# Patient Record
Sex: Female | Born: 1995 | Race: Black or African American | Hispanic: No | Marital: Single
Health system: Southern US, Community
[De-identification: ages and names within clinical notes are randomized; demographics above are authoritative.]

## PROBLEM LIST (undated history)

## (undated) DIAGNOSIS — R51 Headache: Secondary | ICD-10-CM

## (undated) DIAGNOSIS — R519 Headache, unspecified: Secondary | ICD-10-CM

## (undated) DIAGNOSIS — E041 Nontoxic single thyroid nodule: Secondary | ICD-10-CM

## (undated) DIAGNOSIS — E669 Obesity, unspecified: Secondary | ICD-10-CM

## (undated) DIAGNOSIS — H35379 Puckering of macula, unspecified eye: Secondary | ICD-10-CM

## (undated) HISTORY — DX: Headache, unspecified: R51.9

## (undated) HISTORY — DX: Nontoxic single thyroid nodule: E04.1

## (undated) HISTORY — DX: Obesity, unspecified: E66.9

## (undated) HISTORY — DX: Headache: R51

## (undated) HISTORY — DX: Puckering of macula, unspecified eye: H35.379

---
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
## 2017-03-09 ENCOUNTER — Encounter (INDEPENDENT_AMBULATORY_CARE_PROVIDER_SITE_OTHER): Payer: Self-pay | Admitting: Pediatric Endocrinology

## 2017-03-09 ENCOUNTER — Ambulatory Visit (INDEPENDENT_AMBULATORY_CARE_PROVIDER_SITE_OTHER): Payer: Managed Care, Other (non HMO) | Admitting: Pediatric Endocrinology

## 2017-03-09 DIAGNOSIS — E042 Nontoxic multinodular goiter: Secondary | ICD-10-CM | POA: Diagnosis not present

## 2017-03-09 DIAGNOSIS — E559 Vitamin D deficiency, unspecified: Secondary | ICD-10-CM

## 2017-03-09 LAB — POCT GLUCOSE (DEVICE FOR HOME USE): GLUCOSE FASTING, POC: 96 mg/dL (ref 70–99)

## 2017-03-09 LAB — POCT GLYCOSYLATED HEMOGLOBIN (HGB A1C): HEMOGLOBIN A1C: 5.3

## 2017-03-09 NOTE — Progress Notes (Signed)
Subjective:  Subjective  Patient Name: Deborah Smith Date of Birth: 1995/11/30  MRN: [id number]  Deborah Smith  presents to the office today for follow up evaluation and management of her multinodular thyroid goiter  HISTORY OF PRESENT ILLNESS:   Deborah Smith is a 21 y.o. Mixed race female   Deborah Smith was accompanied by her father   1. Deborah Smith was seen by her PCP in February 2017 for follow up of a thyroid goiter. She had an ultrasound in 2016 and a repeat in 2017. Both ultrasounds showed multinodular goiter with no dominant nodules and did not meet criteria for biopsy.  She was complaining of fatigue, hair loss, constipation, and weight gain. She had TFTs drawn which were normal. She was incidentally found to have a vitamin d level of 10.7 She was referred to endocrinology for further evaluation and management.   2. Deborah "Tey-ah" was last seen in [facility] on 09/08/16. In the interim she has been generally healthy.   She was dancing all winter- her season ended this spring. It was her last team dance season and she is a little sad about that- but not really. 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  She has been drinking only water and not eating out as often. She is not taking Synthroid. She is stable on generic Sprintec. She is taking vit D 1000 IU/day.   She has been working out on an elliptical. She usually spends about 20 min-60 minutes. She says that the electric panel display does not work so she doesn't know speed or distance. She does get hot and sweaty and increases her heart rate.   She has not recognized a change in her appetite. She feels that her clothes are a little baggy on her- mostly her pants. She only likes to shop for shirts.   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   She has not been having any issues with her thyroid.   She has not had tachycardia. She did not realize how much weight she lost.      3.. Pertinent Review of  Systems:  Constitutional: The patient feels "alright". The patient seems healthy and active. No headache today.  Eyes: Vision seems to be good. There are no recognized eye problems. Had eye surgery in June 2017 to remove scar tissue of uncertain etiology - vision now improved.  Neck: Per HPI Heart: Heart rate increases with exercise or other physical activity. The patient has no complaints of palpitations, irregular heart beats, chest pain, or chest pressure.   Gastrointestinal: Bowel movents seem normal. The patient has no complaints of excessive hunger, acid reflux, upset stomach, stomach aches or pains, diarrhea.  Legs: Muscle mass and strength seem normal. There are no complaints of numbness, tingling, burning, or pain. No edema is noted.  Feet: There are no obvious foot problems. There are no complaints of numbness, tingling, burning, or pain. No edema is noted. Neurologic: There are no recognized problems with muscle movement and strength, sensation, or coordination. GYN/GU: Periods have improved Continues on Sprintec- cycles are regular. Skin: no skin issues or facial hair   PAST MEDICAL, FAMILY, AND SOCIAL HISTORY  Past Medical History:  Diagnosis Date  . Epiretinal membrane    lamellar hole OS  . Generalized headaches   . Obesity   . Thyroid cyst    Multiple; Rescan Feb 2017    Family History  Problem Relation  Age of Onset  . Diabetes Father   . Hyperlipidemia Father   . Hypertension Father   . Stroke Father   . Diabetes Maternal Grandfather   . Diabetes Paternal Grandmother   . Hypertension Paternal Grandmother   . Hyperlipidemia Paternal Grandmother   . Stroke Paternal Grandmother   . Diabetes Paternal Grandfather   . Hypertension Paternal Grandfather   . Hyperlipidemia Paternal Grandfather   . Stroke Paternal Grandfather      Current Outpatient Prescriptions:  .  cholecalciferol (VITAMIN D) 1000 units tablet, Take 1,000 Units by mouth daily., Disp: , Rfl:  .   levothyroxine (SYNTHROID) 25 MCG tablet, Take 1 tablet (25 mcg total) by mouth daily before breakfast., Disp: 30 tablet, Rfl: 6 .  SPRINTEC 28 0.25-35 MG-MCG tablet, Take 1 tablet by mouth daily., Disp: , Rfl:   Allergies as of 03/09/2017 - Review Complete 03/09/2017  Allergen Reaction Noted  . Penicillins Hives 11/21/2015     reports that she is a non-smoker but has been exposed to tobacco smoke. She has never used smokeless tobacco. She reports that she does not drink alcohol or use drugs. Pediatric History  Patient Guardian Status  . Mother:  Saliah, Crisp  . Father:  Raniya, Golembeski   Other Topics Concern  . Not on file   Social History Narrative   Lives at home with mom and sister, attends [employer] is in the 11th grade.     1. School and Family: 12th grade at [employer]  Starting [employer] in the fall. Considering Psychology.  2. Activities: exercising her thumbs playing video games. Dance/ elliptical.  3. Primary Care Provider: Kerman Passey, MD  ROS: There are no other significant problems involving Karsyn's other body systems.    Objective:  Objective  Vital Signs:  BP 106/64   Pulse 88   Ht 5' 0.08" (1.526 m)   Wt 140 lb 12.8 oz (63.9 kg)   BMI 27.43 kg/m  Blood pressure percentiles are 38.9 % systolic and 48.2 % diastolic based on the August 2017 AAP Clinical Practice Guideline.    Ht Readings from Last 3 Encounters:  03/09/17 5' 0.08" (1.526 m) (5 %, Z= -1.63)*  07/24/16 5' (1.524 m) (5 %, Z= -1.65)*  06/03/16 5' 0.43" (1.535 m) (7 %, Z= -1.48)*   * Growth percentiles are based on CDC 2-20 Years data.   Wt Readings from Last 3 Encounters:  03/09/17 140 lb 12.8 oz (63.9 kg) (74 %, Z= 0.66)*  09/08/16 172 lb 6.4 oz (78.2 kg) (94 %, Z= 1.54)*  07/24/16 183 lb (83 kg) (96 %, Z= 1.74)*   * Growth percentiles are based on CDC 2-20 Years data.   HC Readings from Last 3 Encounters:  No data found for [facility]   Body surface area is 1.65 meters squared. 5  %ile (Z= -1.63) based on CDC 2-20 Years stature-for-age data using vitals from 03/09/2017. 74 %ile (Z= 0.66) based on CDC 2-20 Years weight-for-age data using vitals from 03/09/2017.    PHYSICAL EXAM: \ Constitutional: The patient appears healthy and well nourished. The patient's height and weight are advanced for age. She has lost over 30 pounds since her last visit.  Head: The head is normocephalic. Face: The face appears normal. There are no obvious dysmorphic features. Eyes: The eyes appear to be normally formed and spaced. Gaze is conjugate. There is no obvious arcus or proptosis. Moisture appears normal. Ears: The ears are normally placed and appear externally normal. Mouth: The  oropharynx and tongue appear normal. Dentition appears to be normal for age. Oral moisture is normal. Neck: The neck appears to be visibly normal. BL carotid bruits are noted. The thyroid gland is modestly enlarged in size- it is somewhat smaller than last exam. The consistency of the thyroid gland is normal.  The thyroid gland is not tender to palpation. Lungs: The lungs are clear to auscultation. Air movement is good. Heart: Heart rate and rhythm are regular. Heart sounds S1 and S2 are normal. I did not appreciate any pathologic cardiac murmurs. Abdomen: The abdomen appears to be enlarged in size for the patient's age. Bowel sounds are normal. There is no obvious hepatomegaly, splenomegaly, or other mass effect.  Arms: Muscle size and bulk are normal for age. Hands: There is no obvious tremor. Phalangeal and metacarpophalangeal joints are normal. Palmar muscles are normal for age. Palmar skin is normal. Palmar moisture is also normal. Legs: Muscles appear normal for age. No edema is present. Feet: Feet are normally formed. Dorsalis pedal pulses are normal. Neurologic: Strength is normal for age in both the upper and lower extremities. Muscle tone is normal. Sensation to touch is normal in both the legs and feet.     LAB DATA:   Results for orders placed or performed in visit on 03/09/17 (from the past 672 hour(s))  POCT Glucose (Device for Home Use)   Collection Time: 03/09/17  2:12 PM  Result Value Ref Range   Glucose Fasting, POC 96 70 - 99 mg/dL   POC Glucose  70 - 99 mg/dl  POCT HgB Z6X   Collection Time: 03/09/17  2:40 PM  Result Value Ref Range   Hemoglobin A1C 5.3     Neck ultrasound 07/20/16 Isthmus: 0.2 cm, previously 0.6 cm  No discrete nodules are identified within the thyroid isthmus.  _________________________________________________________  Right lobe: 5.0 x 1.0 x 2.2 cm, previously 4.8 x 1.2 x 1.8 cm  Small nodules measuring 0.4 cm or less.  Not significantly changed.  _________________________________________________________  Left lobe: 5.3 x 1.2 x 1.9 cm, previously 5.1 x 1.5 x 1.6 cm  Small nodules measuring 0.4 cm or less.  Not significantly changed.  IMPRESSION: Stable bilateral small nodules measuring 0.4 cm or less in size.     Assessment and Plan:  Assessment  ASSESSMENT: Aleesa is a 21 y.o. mixed race female with multinodular goiter. Compression symptoms and size of goiter have reduced with low dose Synthroid. Repeat ultrasound at last visit showed stable nodules with no progression. She is no longer taking synthroid.   She has had significant weight loss since last visit and is now considered a healthy weight for her height. She is surprised that she has lost so much weight. She has been working on eating and drinking healthy and has been active daily. She is excited to start college this fall and has plans to continue to be active there.   Will plan to repeat her thyroid ultrasound this fall. She is clinically euthyroid. She is off therapy. She never had abnormal thyroid labs.   She has continued on Vit D 1000 IU/day.   Hemoglobin A1C which was elevated at last visit has decreased nicely with increase in physical activity.    PLAN:  1.  Diagnostic: A1C as above. TFTs and Vit D level and thyroid ultrasound for next visit.  2. Therapeutic:Continue Vit D and generic Sprintec. No synthroid for now.  3. Patient education: Discussed changes with weight loss and change in body since  last visit. She has been exercising more and focused on eating and drinking more healthy. She does not appear to be purging or restricting. She is overall very happy today. Family asked many questions and seemed satisfied with discussion and plan today.  4. Follow-up: Return in about 6 months (around 09/09/2017). Family to call in August to schedule thyroid ultrasound for over her fall break. Follow up appointment over thanksgiving.      Dessa Phi, MD   Level of Service: This visit lasted in excess of 25 minutes. More than 50% of the visit was devoted to counseling.

## 2017-03-09 NOTE — Patient Instructions (Signed)
In August- please call the office with your fall schedule and talk to [facility] University[facility]a or Gala MurdochKassina about scheduling your ultrasound thyroid for fall break.   No labs at this time.   Roller [place]-

## 2017-03-25 ENCOUNTER — Ambulatory Visit: Payer: Managed Care, Other (non HMO) | Admitting: Family Medicine

## 2017-03-31 ENCOUNTER — Ambulatory Visit: Payer: Managed Care, Other (non HMO) | Admitting: Family Medicine

## 2017-09-08 ENCOUNTER — Ambulatory Visit (INDEPENDENT_AMBULATORY_CARE_PROVIDER_SITE_OTHER): Payer: Managed Care, Other (non HMO) | Admitting: Family Medicine

## 2017-09-08 ENCOUNTER — Encounter: Payer: Self-pay | Admitting: Family Medicine

## 2017-09-08 VITALS — BP 114/62 | HR 83 | Temp 97.8°F | Resp 14 | Ht 60.05 in | Wt 134.6 lb

## 2017-09-08 DIAGNOSIS — E042 Nontoxic multinodular goiter: Secondary | ICD-10-CM

## 2017-09-08 DIAGNOSIS — Z111 Encounter for screening for respiratory tuberculosis: Secondary | ICD-10-CM | POA: Diagnosis not present

## 2017-09-08 DIAGNOSIS — Z23 Encounter for immunization: Secondary | ICD-10-CM

## 2017-09-08 DIAGNOSIS — Z0000 Encounter for general adult medical examination without abnormal findings: Secondary | ICD-10-CM | POA: Insufficient documentation

## 2017-09-08 LAB — CBC WITH DIFFERENTIAL/PLATELET
BASOS PCT: 0.5 %
Basophils Absolute: 30 cells/uL (ref 0–200)
EOS ABS: 47 {cells}/uL (ref 15–500)
Eosinophils Relative: 0.8 %
HEMATOCRIT: 42.9 % (ref 35.0–45.0)
Hemoglobin: 14.6 g/dL (ref 11.7–15.5)
LYMPHS ABS: 2213 {cells}/uL (ref 850–3900)
MCH: 29.8 pg (ref 27.0–33.0)
MCHC: 34 g/dL (ref 32.0–36.0)
MCV: 87.6 fL (ref 80.0–100.0)
MPV: 11 fL (ref 7.5–12.5)
Monocytes Relative: 6.4 %
NEUTROS PCT: 54.8 %
Neutro Abs: 3233 cells/uL (ref 1500–7800)
PLATELETS: 292 10*3/uL (ref 140–400)
RBC: 4.9 10*6/uL (ref 3.80–5.10)
RDW: 13 % (ref 11.0–15.0)
TOTAL LYMPHOCYTE: 37.5 %
WBC: 5.9 10*3/uL (ref 3.8–10.8)
WBCMIX: 378 {cells}/uL (ref 200–950)

## 2017-09-08 LAB — LIPID PANEL
Cholesterol: 176 mg/dL — ABNORMAL HIGH (ref <170)
HDL: 68 mg/dL (ref >45)
LDL Cholesterol (Calc): 95 mg/dL (calc) (ref <110)
NON-HDL CHOLESTEROL (CALC): 108 mg/dL (ref <120)
Total CHOL/HDL Ratio: 2.6 (calc) (ref <5.0)
Triglycerides: 50 mg/dL (ref <90)

## 2017-09-08 LAB — COMPLETE METABOLIC PANEL WITH GFR
AG RATIO: 1.7 (calc) (ref 1.0–2.5)
ALT: 13 U/L (ref 5–32)
AST: 15 U/L (ref 12–32)
Albumin: 5 g/dL (ref 3.6–5.1)
Alkaline phosphatase (APISO): 98 U/L (ref 47–176)
BILIRUBIN TOTAL: 0.5 mg/dL (ref 0.2–1.1)
BUN: 7 mg/dL (ref 7–20)
CALCIUM: 10.1 mg/dL (ref 8.9–10.4)
CHLORIDE: 104 mmol/L (ref 98–110)
CO2: 25 mmol/L (ref 20–32)
Creat: 0.68 mg/dL (ref 0.50–1.00)
GFR, EST NON AFRICAN AMERICAN: 127 mL/min/{1.73_m2} (ref >=60)
GFR, Est African American: 147 mL/min/{1.73_m2} (ref >=60)
GLOBULIN: 3 g/dL (ref 2.0–3.8)
Glucose, Bld: 87 mg/dL (ref 65–139)
POTASSIUM: 4.1 mmol/L (ref 3.8–5.1)
SODIUM: 139 mmol/L (ref 135–146)
TOTAL PROTEIN: 8 g/dL (ref 6.3–8.2)

## 2017-09-08 LAB — TSH: TSH: 1.16 mIU/L

## 2017-09-08 NOTE — Assessment & Plan Note (Signed)
Check TSH today

## 2017-09-08 NOTE — Patient Instructions (Addendum)
Meningitis booster given today You will not need another meningitis booster, unless there are special circumstances in your life in five years that would require it Your next tetanus booster will be due in 2021   Health Maintenance, Female Adopting a healthy lifestyle and getting preventive care can go a long way to promote health and wellness. Talk with your health care provider about what schedule of regular examinations is right for you. This is a good chance for you to check in with your provider about disease prevention and staying healthy. In between checkups, there are plenty of things you can do on your own. Experts have done a lot of research about which lifestyle changes and preventive measures are most likely to keep you healthy. Ask your health care provider for more information. Weight and diet Eat a healthy diet  Be sure to include plenty of vegetables, fruits, low-fat dairy products, and lean protein.  Do not eat a lot of foods high in solid fats, added sugars, or salt.  Get regular exercise. This is one of the most important things you can do for your health. ? Most adults should exercise for at least 150 minutes each week. The exercise should increase your heart rate and make you sweat (moderate-intensity exercise). ? Most adults should also do strengthening exercises at least twice a week. This is in addition to the moderate-intensity exercise.  Maintain a healthy weight  Body mass index (BMI) is a measurement that can be used to identify possible weight problems. It estimates body fat based on height and weight. Your health care provider can help determine your BMI and help you achieve or maintain a healthy weight.  For females 38 years of age and older: ? A BMI below 18.5 is considered underweight. ? A BMI of 18.5 to 24.9 is normal. ? A BMI of 25 to 29.9 is considered overweight. ? A BMI of 30 and above is considered obese.  Watch levels of cholesterol and blood  lipids  You should start having your blood tested for lipids and cholesterol at 19 years of age, then have this test every 5 years.  You may need to have your cholesterol levels checked more often if: ? Your lipid or cholesterol levels are high. ? You are older than 19 years of age. ? You are at high risk for heart disease.  Cancer screening Lung Cancer  Lung cancer screening is recommended for adults 62-59 years old who are at high risk for lung cancer because of a history of smoking.  A yearly low-dose CT scan of the lungs is recommended for people who: ? Currently smoke. ? Have quit within the past 15 years. ? Have at least a 30-pack-year history of smoking. A pack year is smoking an average of one pack of cigarettes a day for 1 year.  Yearly screening should continue until it has been 15 years since you quit.  Yearly screening should stop if you develop a health problem that would prevent you from having lung cancer treatment.  Breast Cancer  Practice breast self-awareness. This means understanding how your breasts normally appear and feel.  It also means doing regular breast self-exams. Let your health care provider know about any changes, no matter how small.  If you are in your 20s or 30s, you should have a clinical breast exam (CBE) by a health care provider every 1-3 years as part of a regular health exam.  If you are 44 or older, have a CBE  every year. Also consider having a breast X-ray (mammogram) every year.  If you have a family history of breast cancer, talk to your health care provider about genetic screening.  If you are at high risk for breast cancer, talk to your health care provider about having an MRI and a mammogram every year.  Breast cancer gene (BRCA) assessment is recommended for women who have family members with BRCA-related cancers. BRCA-related cancers include: ? Breast. ? Ovarian. ? Tubal. ? Peritoneal cancers.  Results of the assessment will  determine the need for genetic counseling and BRCA1 and BRCA2 testing.  Cervical Cancer Your health care provider may recommend that you be screened regularly for cancer of the pelvic organs (ovaries, uterus, and vagina). This screening involves a pelvic examination, including checking for microscopic changes to the surface of your cervix (Pap test). You may be encouraged to have this screening done every 3 years, beginning at age 59.  For women ages 61-65, health care providers may recommend pelvic exams and Pap testing every 3 years, or they may recommend the Pap and pelvic exam, combined with testing for human papilloma virus (HPV), every 5 years. Some types of HPV increase your risk of cervical cancer. Testing for HPV may also be done on women of any age with unclear Pap test results.  Other health care providers may not recommend any screening for nonpregnant women who are considered low risk for pelvic cancer and who do not have symptoms. Ask your health care provider if a screening pelvic exam is right for you.  If you have had past treatment for cervical cancer or a condition that could lead to cancer, you need Pap tests and screening for cancer for at least 20 years after your treatment. If Pap tests have been discontinued, your risk factors (such as having a new sexual partner) need to be reassessed to determine if screening should resume. Some women have medical problems that increase the chance of getting cervical cancer. In these cases, your health care provider may recommend more frequent screening and Pap tests.  Colorectal Cancer  This type of cancer can be detected and often prevented.  Routine colorectal cancer screening usually begins at 19 years of age and continues through 19 years of age.  Your health care provider may recommend screening at an earlier age if you have risk factors for colon cancer.  Your health care provider may also recommend using home test kits to check  for hidden blood in the stool.  A small camera at the end of a tube can be used to examine your colon directly (sigmoidoscopy or colonoscopy). This is done to check for the earliest forms of colorectal cancer.  Routine screening usually begins at age 66.  Direct examination of the colon should be repeated every 5-10 years through 19 years of age. However, you may need to be screened more often if early forms of precancerous polyps or small growths are found.  Skin Cancer  Check your skin from head to toe regularly.  Tell your health care provider about any new moles or changes in moles, especially if there is a change in a mole's shape or color.  Also tell your health care provider if you have a mole that is larger than the size of a pencil eraser.  Always use sunscreen. Apply sunscreen liberally and repeatedly throughout the day.  Protect yourself by wearing long sleeves, pants, a wide-brimmed hat, and sunglasses whenever you are outside.  Heart disease, diabetes,  and high blood pressure  High blood pressure causes heart disease and increases the risk of stroke. High blood pressure is more likely to develop in: ? People who have blood pressure in the high end of the normal range (130-139/85-89 mm Hg). ? People who are overweight or obese. ? People who are African American.  If you are 75-23 years of age, have your blood pressure checked every 3-5 years. If you are 40 years of age or older, have your blood pressure checked every year. You should have your blood pressure measured twice-once when you are at a hospital or clinic, and once when you are not at a hospital or clinic. Record the average of the two measurements. To check your blood pressure when you are not at a hospital or clinic, you can use: ? An automated blood pressure machine at a pharmacy. ? A home blood pressure monitor.  If you are between 42 years and 33 years old, ask your health care provider if you should take  aspirin to prevent strokes.  Have regular diabetes screenings. This involves taking a blood sample to check your fasting blood sugar level. ? If you are at a normal weight and have a low risk for diabetes, have this test once every three years after 19 years of age. ? If you are overweight and have a high risk for diabetes, consider being tested at a younger age or more often. Preventing infection Hepatitis B  If you have a higher risk for hepatitis B, you should be screened for this virus. You are considered at high risk for hepatitis B if: ? You were born in a country where hepatitis B is common. Ask your health care provider which countries are considered high risk. ? Your parents were born in a high-risk country, and you have not been immunized against hepatitis B (hepatitis B vaccine). ? You have HIV or AIDS. ? You use needles to inject street drugs. ? You live with someone who has hepatitis B. ? You have had sex with someone who has hepatitis B. ? You get hemodialysis treatment. ? You take certain medicines for conditions, including cancer, organ transplantation, and autoimmune conditions.  Hepatitis C  Blood testing is recommended for: ? Everyone born from [birth year] through 1963. ? Anyone with known risk factors for hepatitis C.  Sexually transmitted infections (STIs)  You should be screened for sexually transmitted infections (STIs) including gonorrhea and chlamydia if: ? You are sexually active and are younger than 19 years of age. ? You are older than 19 years of age and your health care provider tells you that you are at risk for this type of infection. ? Your sexual activity has changed since you were last screened and you are at an increased risk for chlamydia or gonorrhea. Ask your health care provider if you are at risk.  If you do not have HIV, but are at risk, it may be recommended that you take a prescription medicine daily to prevent HIV infection. This is called  pre-exposure prophylaxis (PrEP). You are considered at risk if: ? You are sexually active and do not regularly use condoms or know the HIV status of your partner(s). ? You take drugs by injection. ? You are sexually active with a partner who has HIV.  Talk with your health care provider about whether you are at high risk of being infected with HIV. If you choose to begin PrEP, you should first be tested for HIV. You should then  tested every 3 months for as long as you are taking PrEP. Pregnancy  If you are premenopausal and you may become pregnant, ask your health care provider about preconception counseling.  If you may become pregnant, take 400 to 800 micrograms (mcg) of folic acid every day.  If you want to prevent pregnancy, talk to your health care provider about birth control (contraception). Osteoporosis and menopause  Osteoporosis is a disease in which the bones lose minerals and strength with aging. This can result in serious bone fractures. Your risk for osteoporosis can be identified using a bone density scan.  If you are 65 years of age or older, or if you are at risk for osteoporosis and fractures, ask your health care provider if you should be screened.  Ask your health care provider whether you should take a calcium or vitamin D supplement to lower your risk for osteoporosis.  Menopause may have certain physical symptoms and risks.  Hormone replacement therapy may reduce some of these symptoms and risks. Talk to your health care provider about whether hormone replacement therapy is right for you. Follow these instructions at home:  Schedule regular health, dental, and eye exams.  Stay current with your immunizations.  Do not use any tobacco products including cigarettes, chewing tobacco, or electronic cigarettes.  If you are pregnant, do not drink alcohol.  If you are breastfeeding, limit how much and how often you drink alcohol.  Limit alcohol intake to no more  than 1 drink per day for nonpregnant women. One drink equals 12 ounces of beer, 5 ounces of wine, or 1 ounces of hard liquor.  Do not use street drugs.  Do not share needles.  Ask your health care provider for help if you need support or information about quitting drugs.  Tell your health care provider if you often feel depressed.  Tell your health care provider if you have ever been abused or do not feel safe at home. This information is not intended to replace advice given to you by your health care provider. Make sure you discuss any questions you have with your health care provider. Document Released: 04/13/2011 Document Revised: 03/05/2016 Document Reviewed: 07/02/2015 Elsevier Interactive Patient Education  2018 [employer]  

## 2017-09-08 NOTE — Progress Notes (Signed)
BP 114/62   Pulse 83   Temp 97.8 F (36.6 C) (Oral)   Resp 14   Ht 5' 0.05" (1.525 m)   Wt 134 lb 9.6 oz (61.1 kg)   SpO2 96%   BMI 26.24 kg/m    Subjective:    Patient ID: Deborah Smith, female    DOB: [birth year], 21 y.o.   MRN: [id number]  HPI: Deborah Smith is a 21 y.o. female  Chief Complaint  Patient presents with  . Annual Exam    colege cpe    HPI She needs a health assessment for ACC No trouble with lifting, no back issues No breathing trouble No heart problems No violent outbursts or depression or difficulty coping TB testing: no nights sweats; no chronic cough, no fevers, no unexplained weight loss  She is going on a trip soon in [place] Long-distance girlfriend  Depression screen [facility] 2[facility]9 09/08/2017  Decreased Interest 0  Down, Depressed, Hopeless 0  PHQ - 2 Score 0    Relevant past medical, surgical, family and social history reviewed Past Medical History:  Diagnosis Date  . Epiretinal membrane    lamellar hole OS  . Generalized headaches   . Obesity   . Thyroid cyst    Multiple; Rescan Feb 2017   No past surgical history on file. Family History  Problem Relation Age of Onset  . Diabetes Father   . Hyperlipidemia Father   . Hypertension Father   . Stroke Father   . Diabetes Maternal Grandfather   . Heart disease Maternal Grandfather   . Diabetes Paternal Grandmother   . Hypertension Paternal Grandmother   . Hyperlipidemia Paternal Grandmother   . Stroke Paternal Grandmother   . Diabetes Paternal Grandfather   . Hypertension Paternal Grandfather   . Hyperlipidemia Paternal Grandfather   . Stroke Paternal Grandfather    Social History   Tobacco Use  . Smoking status: Passive Smoke Exposure - Never Smoker  . Smokeless tobacco: Never Used  Substance Use Topics  . Alcohol use: No  . Drug use: No    Interim medical history since last visit reviewed. Allergies and medications reviewed  Review of Systems Per HPI unless specifically  indicated above     Objective:    BP 114/62   Pulse 83   Temp 97.8 F (36.6 C) (Oral)   Resp 14   Ht 5' 0.05" (1.525 m)   Wt 134 lb 9.6 oz (61.1 kg)   SpO2 96%   BMI 26.24 kg/m   Wt Readings from Last 3 Encounters:  09/08/17 134 lb 9.6 oz (61.1 kg) (64 %, Z= 0.36)*  03/09/17 140 lb 12.8 oz (63.9 kg) (74 %, Z= 0.66)*  09/08/16 172 lb 6.4 oz (78.2 kg) (94 %, Z= 1.54)*   * Growth percentiles are based on CDC (Girls, 2-20 Years) data.    Physical Exam  Constitutional: She appears well-developed and well-nourished. No distress.  HENT:  Head: Normocephalic and atraumatic.  Right Ear: Tympanic membrane, external ear and ear canal normal. Tympanic membrane is not erythematous. No middle ear effusion.  Left Ear: External ear and ear canal normal. Tympanic membrane is not erythematous.  No middle ear effusion.  Nose: No rhinorrhea.  Mouth/Throat: Oropharynx is clear and moist and mucous membranes are normal.  Eyes: EOM are normal. No scleral icterus.  Neck: No thyromegaly present.  Cardiovascular: Normal rate, regular rhythm and normal heart sounds.  No murmur heard. Pulmonary/Chest: Effort normal and breath sounds normal. No  respiratory distress. She has no wheezes.  Abdominal: Soft. Bowel sounds are normal. She exhibits no distension.  Musculoskeletal: Normal range of motion. She exhibits no edema.  Lymphadenopathy:    She has no cervical adenopathy.  Neurological: She is alert. She displays no tremor. She exhibits normal muscle tone.  Reflex Scores:      Patellar reflexes are 2+ on the right side and 2+ on the left side. Skin: Skin is warm and dry. She is not diaphoretic. No pallor.  Psychiatric: She has a normal mood and affect. Her behavior is normal. Judgment and thought content normal. Her mood appears not anxious. She does not exhibit a depressed mood.    Results for orders placed or performed in visit on 03/09/17  POCT HgB A1C  Result Value Ref Range   Hemoglobin A1C  5.3   POCT Glucose (Device for Home Use)  Result Value Ref Range   Glucose Fasting, POC 96 70 - 99 mg/dL   POC Glucose  70 - 99 mg/dl      Assessment & Plan:   Problem List Items Addressed This Visit      Endocrine   Multinodular goiter    Check TSH today      Relevant Orders   TSH     Other   Preventative health care - Primary    Okay to fill out form if needed for [facility], I am clearing her; will get labs and offer vaccines today      Relevant Orders   TSH   CBC with Differential/Platelet   COMPLETE METABOLIC PANEL WITH GFR   Lipid panel    Other Visit Diagnoses    Screening for tuberculosis       Relevant Orders   Quantiferon tb gold assay   Need for meningitis vaccination       Relevant Orders   Meningococcal MCV4O(Menveo) (Completed)       Follow up plan: Return in about 1 year (around 09/08/2018) for complete physical.  An after-visit summary was printed and given to the patient at check-out.  Please see the patient instructions which may contain other information and recommendations beyond what is mentioned above in the assessment and plan.  No orders of the defined types were placed in this encounter.   Orders Placed This Encounter  Procedures  . Meningococcal MCV4O(Menveo)  . Quantiferon tb gold assay  . TSH  . CBC with Differential/Platelet  . COMPLETE METABOLIC PANEL WITH GFR  . Lipid panel

## 2017-09-08 NOTE — Assessment & Plan Note (Signed)
Okay to fill out form [facility], I am clearing her; will get labs and offer vaccines today

## 2017-09-09 ENCOUNTER — Ambulatory Visit (INDEPENDENT_AMBULATORY_CARE_PROVIDER_SITE_OTHER): Payer: Managed Care, Other (non HMO) | Admitting: Pediatric Endocrinology

## 2017-09-09 LAB — QUANTIFERON TB GOLD ASSAY (BLOOD)
Mitogen-Nil: >10 IU/mL
QUANTIFERON NIL VALUE: 0.03 [IU]/mL
QUANTIFERON(R)-TB GOLD: NEGATIVE

## 2017-09-09 NOTE — Progress Notes (Deleted)
Subjective:  Subjective  Patient Name: Deborah Smith Date of Birth: 1995/11/07  MRN: [id number]  Deborah Smith  presents to the office today for follow up evaluation and management of her multinodular thyroid goiter  HISTORY OF PRESENT ILLNESS:   Deborah Smith is a 21 y.o. Mixed race female   Deborah Smith was accompanied by her father ***  1. Deborah Smith was seen by her PCP in February 2017 for follow up of a thyroid goiter. She had an ultrasound in 2016 and a repeat in 2017. Both ultrasounds showed multinodular goiter with no dominant nodules and did not meet criteria for biopsy.  She was complaining of fatigue, hair loss, constipation, and weight gain. She had TFTs drawn which were normal. She was incidentally found to have a vitamin d level of 10.7 She was referred to endocrinology for further evaluation and management.   2. Deborah "Tey-ah" was last seen in [facility] on 09/08/16. In the interim she has been generally healthy.  ***  She was dancing all winter- her season ended this spring. It was her last team dance season and she is a little sad about that- but not really. 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  She has been drinking only water and not eating out as often. She is not taking Synthroid. She is stable on generic Sprintec. She is taking vit D 1000 IU/day.   She has been working out on an elliptical. She usually spends about 20 min-60 minutes. She says that the electric panel display does not work so she doesn't know speed or distance. She does get hot and sweaty and increases her heart rate.   She has not recognized a change in her appetite. She feels that her clothes are a little baggy on her- mostly her pants. She only likes to shop for shirts.   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   She has not been having any issues with her thyroid.   She has not had tachycardia. She did not realize how much weight she lost.      3.. Pertinent Review  of Systems:  Constitutional: The patient feels "alright***". The patient seems healthy and active. No headache today.  Eyes: Vision seems to be good. There are no recognized eye problems. Had eye surgery in June 2017 to remove scar tissue of uncertain etiology - vision now improved.  Neck: Per HPI Heart: Heart rate increases with exercise or other physical activity. The patient has no complaints of palpitations, irregular heart beats, chest pain, or chest pressure.   Gastrointestinal: Bowel movents seem normal. The patient has no complaints of excessive hunger, acid reflux, upset stomach, stomach aches or pains, diarrhea.  Legs: Muscle mass and strength seem normal. There are no complaints of numbness, tingling, burning, or pain. No edema is noted.  Feet: There are no obvious foot problems. There are no complaints of numbness, tingling, burning, or pain. No edema is noted. Neurologic: There are no recognized problems with muscle movement and strength, sensation, or coordination. GYN/GU: Periods have improved Continues on Sprintec- cycles are regular. Skin: no skin issues or facial hair   PAST MEDICAL, FAMILY, AND SOCIAL HISTORY  Past Medical History:  Diagnosis Date  . Epiretinal membrane    lamellar hole OS  . Generalized headaches   . Obesity   . 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 Diabetes Father   . Hyperlipidemia Father   . Hypertension Father   . Stroke Father   . Diabetes Maternal Grandfather   . Heart disease Maternal Grandfather   . Diabetes Paternal Grandmother   . Hypertension Paternal Grandmother   . Hyperlipidemia Paternal Grandmother   . Stroke Paternal Grandmother   . Diabetes Paternal Grandfather   . Hypertension Paternal Grandfather   . Hyperlipidemia Paternal Grandfather   . Stroke Paternal Grandfather      Current Outpatient Medications:  .  cholecalciferol (VITAMIN D) 1000 units tablet, Take  1,000 Units by mouth daily., Disp: , Rfl:  .  SPRINTEC 28 0.25-35 MG-MCG tablet, Take 1 tablet by mouth daily., Disp: , Rfl:   Allergies as of 09/09/2017 - Review Complete 09/08/2017  Allergen Reaction Noted  . Penicillins Hives 11/21/2015     reports that she is a non-smoker but has been exposed to tobacco smoke. she has never used smokeless tobacco. She reports that she does not drink alcohol or use drugs. Pediatric History  Patient Guardian Status  . Mother:  Tonianne, Fine  . Father:  Blannie, Shedlock   Other Topics Concern  . Not on file  Social History Narrative   Lives at home with mom and sister, attends [employer] is in the 11th grade.     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  *** 2. Activities: exercising her thumbs playing video games. Dance/ elliptical.  3. Primary Care Provider: Kerman Passey, MD  ROS: There are no other significant problems involving Deborah Smith other body systems.    Objective:  Objective  Vital Signs:  There were no vitals taken for this visit. No blood pressure reading on file for this encounter. ***   Ht Readings from Last 3 Encounters:  09/08/17 5' 0.05" (1.525 m) (5 %, Z= -1.65)*  03/09/17 5' 0.08" (1.526 m) (5 %, Z= -1.63)*  07/24/16 5' (1.524 m) (5 %, Z= -1.65)*   * Growth percentiles are based on CDC (Girls, 2-20 Years) data.   Wt Readings from Last 3 Encounters:  09/08/17 134 lb 9.6 oz (61.1 kg) (64 %, Z= 0.36)*  03/09/17 140 lb 12.8 oz (63.9 kg) (74 %, Z= 0.66)*  09/08/16 172 lb 6.4 oz (78.2 kg) (94 %, Z= 1.54)*   * Growth percentiles are based on CDC (Girls, 2-20 Years) data.   HC Readings from Last 3 Encounters:  No data found for [facility]   There is no height or weight on file to calculate BSA. No height on file for this encounter. No weight on file for this encounter.    PHYSICAL EXAM: \ *** Constitutional: The patient appears healthy and well nourished. The patient's height and weight are  advanced for age. She has lost over 30 pounds since her last visit.  Head: The head is normocephalic. Face: The face appears normal. There are no obvious dysmorphic features. Eyes: The eyes appear to be normally formed and spaced. Gaze is conjugate. There is no obvious arcus or proptosis. Moisture appears normal. Ears: The ears are normally placed and appear externally normal. Mouth: The oropharynx and tongue appear normal. Dentition appears to be normal for age. Oral moisture is normal. Neck: The neck appears to be visibly normal. BL carotid bruits are noted. The thyroid gland is modestly enlarged in size- it is somewhat smaller than last exam. The consistency of the thyroid gland is normal.  The thyroid gland is not tender to palpation. Lungs:  The lungs are clear to auscultation. Air movement is good. Heart: Heart rate and rhythm are regular. Heart sounds S1 and S2 are normal. I did not appreciate any pathologic cardiac murmurs. Abdomen: The abdomen appears to be enlarged in size for the patient's age. Bowel sounds are normal. There is no obvious hepatomegaly, splenomegaly, or other mass effect.  Arms: Muscle size and bulk are normal for age. Hands: There is no obvious tremor. Phalangeal and metacarpophalangeal joints are normal. Palmar muscles are normal for age. Palmar skin is normal. Palmar moisture is also normal. Legs: Muscles appear normal for age. No edema is present. Feet: Feet are normally formed. Dorsalis pedal pulses are normal. Neurologic: Strength is normal for age in both the upper and lower extremities. Muscle tone is normal. Sensation to touch is normal in both the legs and feet.    LAB DATA:   Results for orders placed or performed in visit on 09/08/17 (from the past 672 hour(s))  TSH   Collection Time: 09/08/17 11:35 AM  Result Value Ref Range   TSH 1.16 mIU/L  CBC with Differential/Platelet   Collection Time: 09/08/17 11:35 AM  Result Value Ref Range   WBC 5.9 3.8 -  10.8 Thousand/uL   RBC 4.90 3.80 - 5.10 Million/uL   Hemoglobin 14.6 11.7 - 15.5 g/dL   HCT 16.142.9 09.635.0 - 04.545.0 %   MCV 87.6 80.0 - 100.0 fL   MCH 29.8 27.0 - 33.0 pg   MCHC 34.0 32.0 - 36.0 g/dL   RDW 40.913.0 81.111.0 - 91.415.0 %   Platelets 292 140 - 400 Thousand/uL   MPV 11.0 7.5 - 12.5 fL   Neutro Abs 3,233 1,500 - 7,800 cells/uL   Lymphs Abs 2,213 850 - 3,900 cells/uL   WBC mixed population 378 200 - 950 cells/uL   Eosinophils Absolute 47 15 - 500 cells/uL   Basophils Absolute 30 0 - 200 cells/uL   Neutrophils Relative % 54.8 %   Total Lymphocyte 37.5 %   Monocytes Relative 6.4 %   Eosinophils Relative 0.8 %   Basophils Relative 0.5 %  COMPLETE METABOLIC PANEL WITH GFR   Collection Time: 09/08/17 11:35 AM  Result Value Ref Range   Glucose, Bld 87 65 - 139 mg/dL   BUN 7 7 - 20 mg/dL   Creat 7.820.68 9.560.50 - 2.131.00 mg/dL   GFR, Est Non African American 127 > OR = 60 mL/min/1.4873m2   GFR, Est African American 147 > OR = 60 mL/min/1.6773m2   BUN/Creatinine Ratio NOT APPLICABLE 6 - 22 (calc)   Sodium 139 135 - 146 mmol/L   Potassium 4.1 3.8 - 5.1 mmol/L   Chloride 104 98 - 110 mmol/L   CO2 25 20 - 32 mmol/L   Calcium 10.1 8.9 - 10.4 mg/dL   Total Protein 8.0 6.3 - 8.2 g/dL   Albumin 5.0 3.6 - 5.1 g/dL   Globulin 3.0 2.0 - 3.8 g/dL (calc)   AG Ratio 1.7 1.0 - 2.5 (calc)   Total Bilirubin 0.5 0.2 - 1.1 mg/dL   Alkaline phosphatase (APISO) 98 47 - 176 U/L   AST 15 12 - 32 U/L   ALT 13 5 - 32 U/L  Lipid panel   Collection Time: 09/08/17 11:35 AM  Result Value Ref Range   Cholesterol 176 (H) <170 mg/dL   HDL 68 >08>45 mg/dL   Triglycerides 50 <65<90 mg/dL   LDL Cholesterol (Calc) 95 <784<110 mg/dL (calc)   Total CHOL/HDL Ratio 2.6 <5.0 (calc)  Non-HDL Cholesterol (Calc) 108 <120 mg/dL (calc)   *** Neck ultrasound 07/20/16 Isthmus: 0.2 cm, previously 0.6 cm  No discrete nodules are identified within the thyroid isthmus.  _________________________________________________________  Right lobe:  5.0 x 1.0 x 2.2 cm, previously 4.8 x 1.2 x 1.8 cm  Small nodules measuring 0.4 cm or less.  Not significantly changed.  _________________________________________________________  Left lobe: 5.3 x 1.2 x 1.9 cm, previously 5.1 x 1.5 x 1.6 cm  Small nodules measuring 0.4 cm or less.  Not significantly changed.  IMPRESSION: Stable bilateral small nodules measuring 0.4 cm or less in size.     Assessment and Plan:  Assessment  ASSESSMENT: Deborah Smith is a 21 y.o. mixed race female with multinodular goiter. Compression symptoms and size of goiter have reduced with low dose Synthroid. Repeat ultrasound at last visit showed stable nodules with no progression. She is no longer taking synthroid.  *** She has had significant weight loss since last visit and is now considered a healthy weight for her height. She is surprised that she has lost so much weight. She has been working on eating and drinking healthy and has been active daily. She is excited to start college this fall and has plans to continue to be active there.   Will plan to repeat her thyroid ultrasound this fall. She is clinically euthyroid. She is off therapy. She never had abnormal thyroid labs.   She has continued on Vit D 1000 IU/day.   Hemoglobin A1C which was elevated at last visit has decreased nicely with increase in physical activity.    PLAN: *** 1. Diagnostic: A1C as above. TFTs and Vit D level and thyroid ultrasound for next visit.  2. Therapeutic:Continue Vit D and generic Sprintec. No synthroid for now.  3. Patient education: Discussed changes with weight loss and change in body since last visit. She has been exercising more and focused on eating and drinking more healthy. She does not appear to be purging or restricting. She is overall very happy today. Family asked many questions and seemed satisfied with discussion and plan today.  4. Follow-up: No Follow-up on file. Family to call in August to schedule thyroid  ultrasound for over her fall break. Follow up appointment over thanksgiving.      Dessa Phi, MD  ***

## 2018-03-25 IMAGING — US US SOFT TISSUE HEAD/NECK
1 series · 14 of 25 positions shown · non-contrast
Comparison: 11/28/2015

CLINICAL DATA: Goiter.  Multi nodular goiter

EXAM:
THYROID ULTRASOUND
TECHNIQUE: Ultrasound examination of the thyroid gland and adjacent soft
tissues was performed.

[Series 1: us soft tissue head/neck · 0.06mm/px · 14 of 50 slices shown]
[im 1/50]
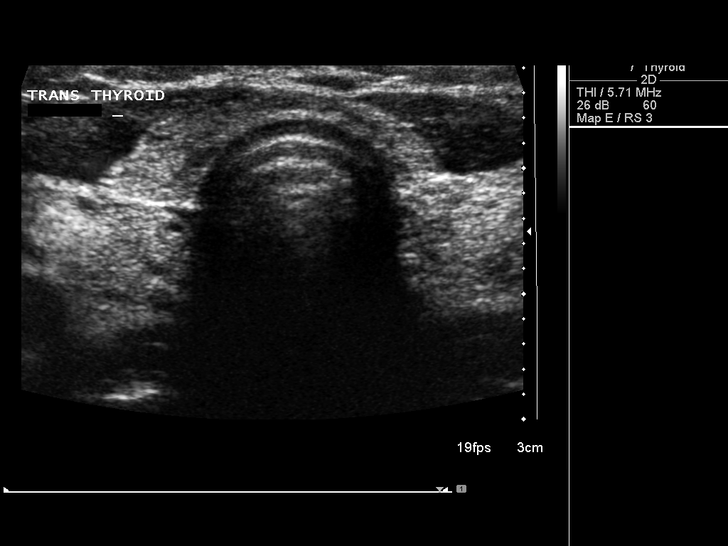
[im 5/50]
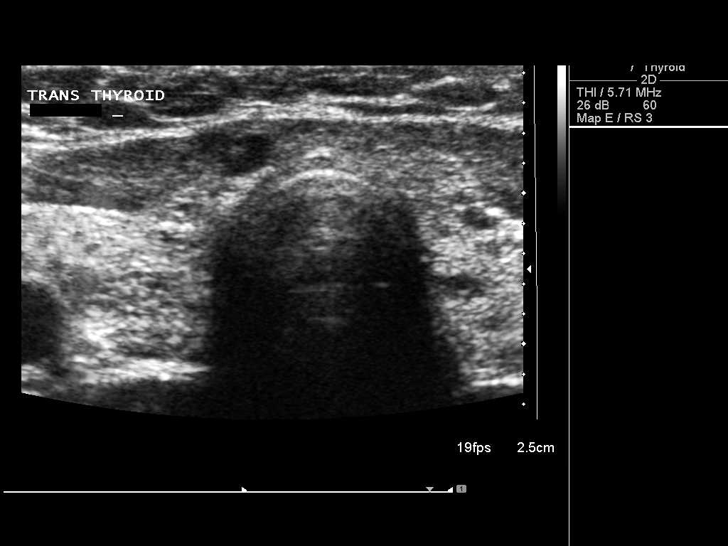
[im 9/50]
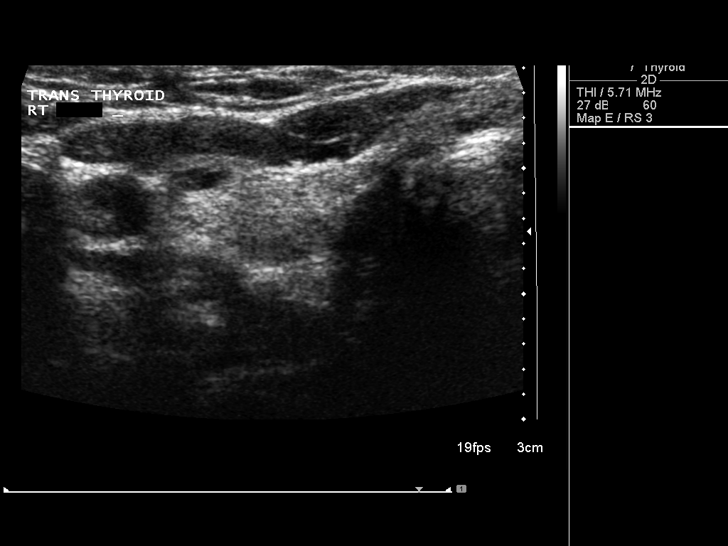
[im 13/50]
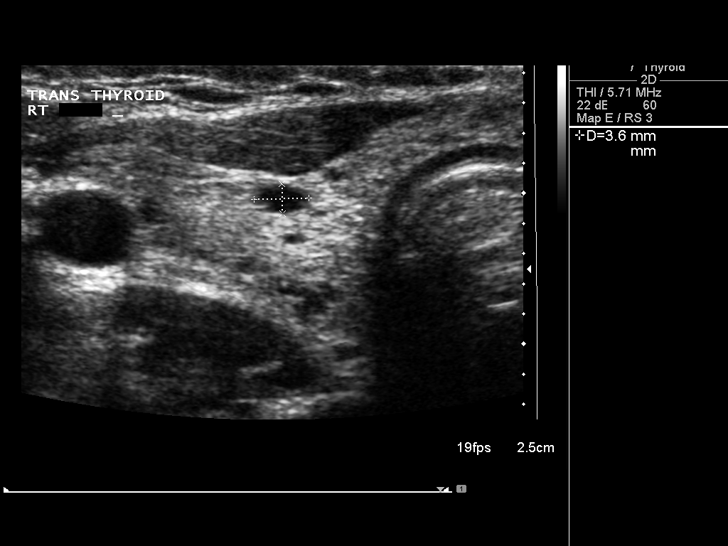
[im 17/50]
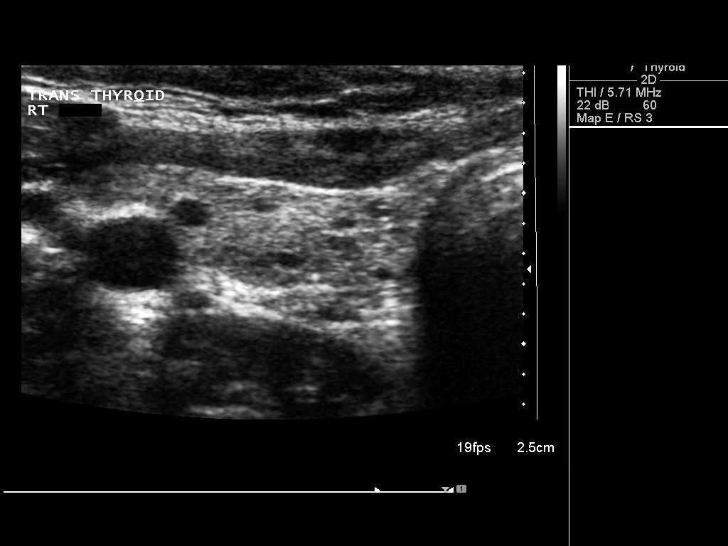
[im 19/50]
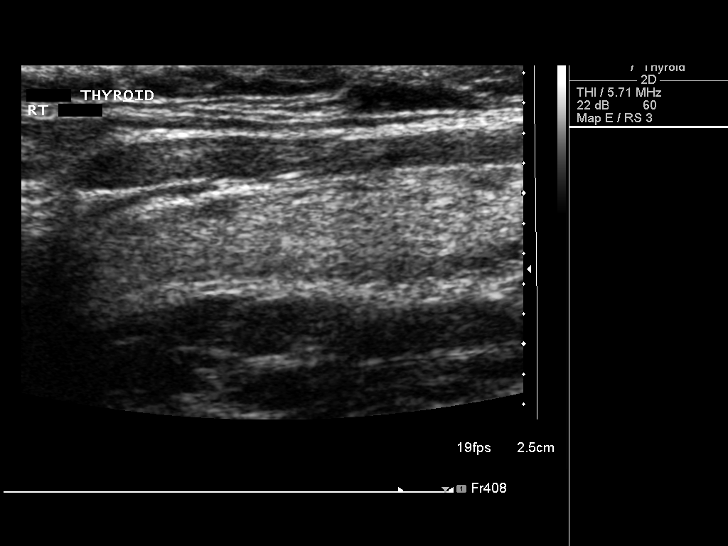
[im 23/50]
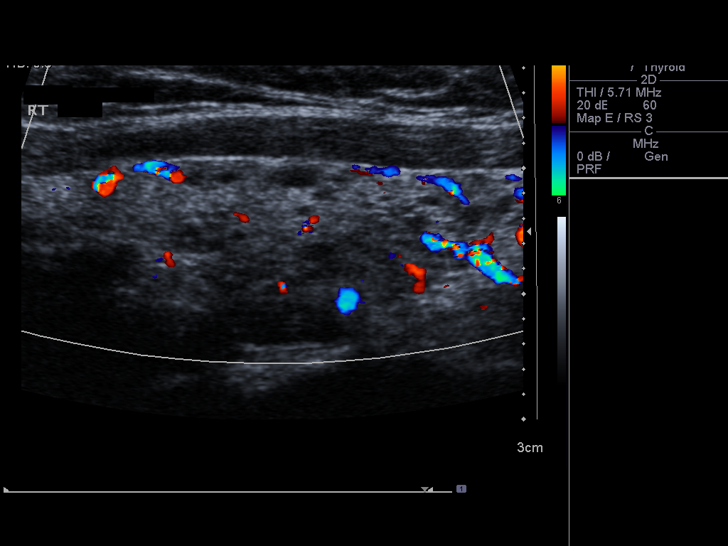
[im 27/50]
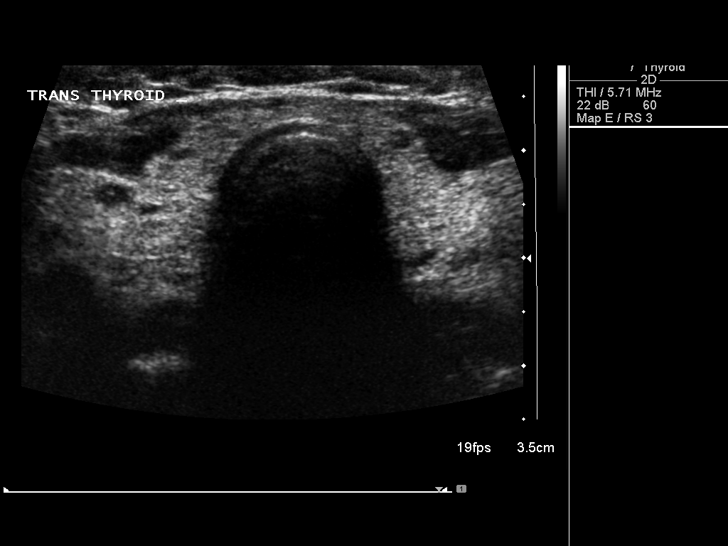
[im 31/50]
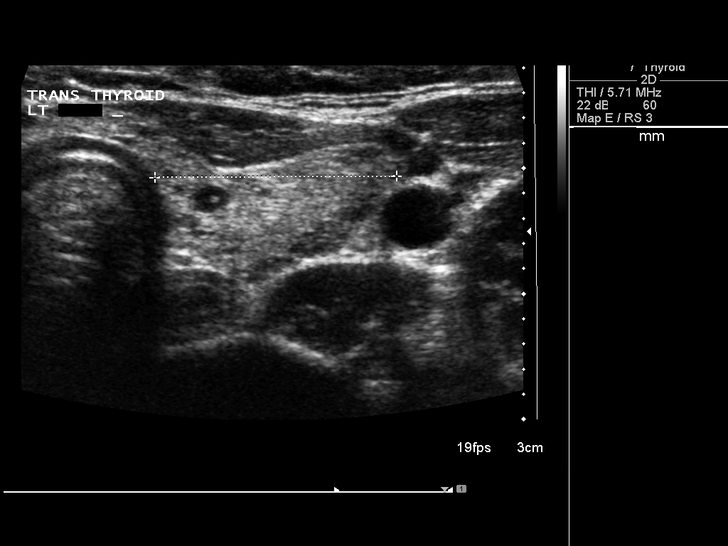
[im 33/50]
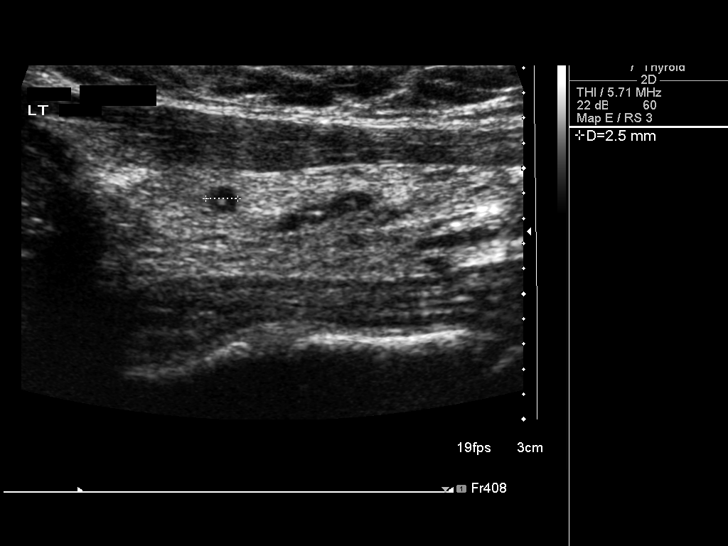
[im 37/50]
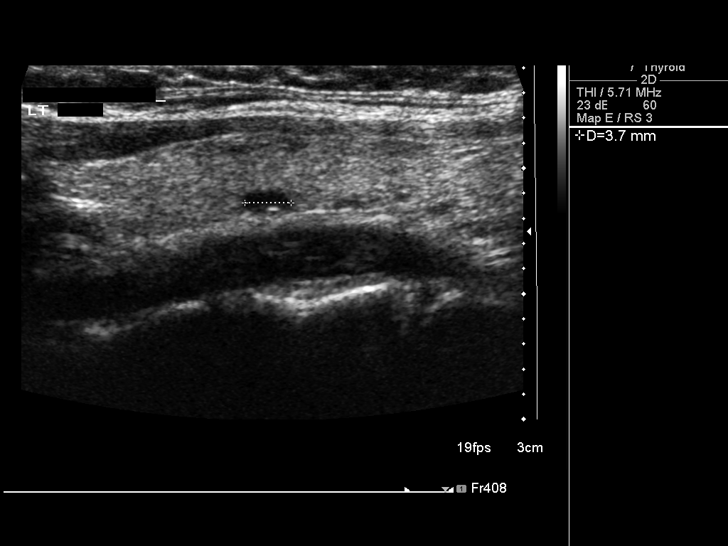
[im 41/50]
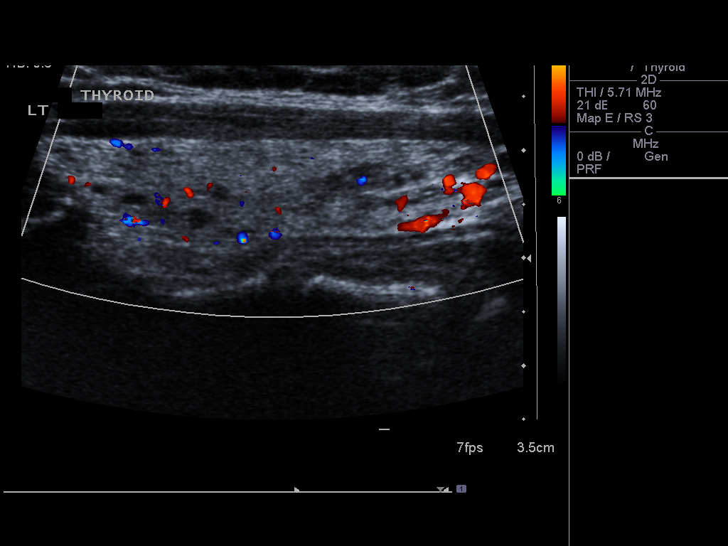
[im 45/50]
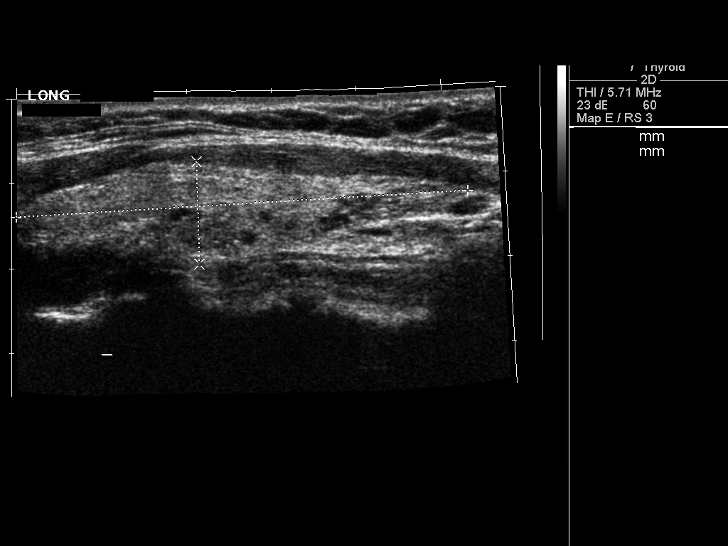
[im 50/50]
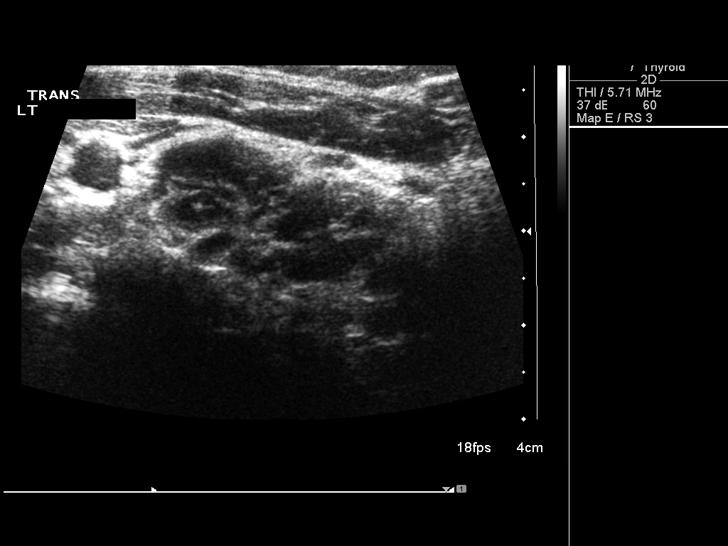

[14 of 25 positions shown; findings below may reference images not displayed]

FINDINGS: Parenchymal Echotexture: Moderately heterogenous

Estimated total number of nodules >/= 1 cm: 0

Number of spongiform nodules >/=  2 cm not described below (TR1): 0

Number of mixed cystic and solid nodules >/= 1.5 cm not described
below (TR2): 0

_________________________________________________________

Isthmus: 0.2 cm, previously 0.6 cm

No discrete nodules are identified within the thyroid isthmus.

_________________________________________________________

Right lobe: 5.0 x 1.0 x 2.2 cm, previously 4.8 x 1.2 x 1.8 cm

Small nodules measuring 0.4 cm or less.  Not significantly changed.

_________________________________________________________

Left lobe: 5.3 x 1.2 x 1.9 cm, previously 5.1 x 1.5 x 1.6 cm

Small nodules measuring 0.4 cm or less.  Not significantly changed.
IMPRESSION: Stable bilateral small nodules measuring 0.4 cm or less in size.

The above is in keeping with the ACR TI-RADS recommendations - [HOSPITAL] 2223;[DATE].

## 2018-06-27 ENCOUNTER — Telehealth: Payer: Self-pay

## 2018-06-27 NOTE — Telephone Encounter (Signed)
Left voicemail that Dr. Lada wanted me to call she is trying to close care gaps and would like for you to come in for chlamydia testing by the end of the week.  Please call back if you have any questions for her. 

## 2018-09-13 ENCOUNTER — Encounter: Payer: Self-pay | Admitting: Family Medicine
